# Patient Record
Sex: Female | Born: 1990 | Race: Black or African American | Hispanic: No | Marital: Single
Health system: Southern US, Community
[De-identification: ages and names within clinical notes are randomized; demographics above are authoritative.]

---

## 2011-09-21 ENCOUNTER — Emergency Department: Payer: Self-pay | Admitting: Emergency Medicine

## 2011-12-21 ENCOUNTER — Inpatient Hospital Stay: Payer: Self-pay | Admitting: Obstetrics and Gynecology

## 2011-12-21 LAB — RBC: RBC: 3.24 10*6/uL — ABNORMAL LOW (ref 3.80–5.20)

## 2011-12-21 LAB — URINALYSIS, COMPLETE
Bilirubin,UR: NEGATIVE
Glucose,UR: 50 mg/dL (ref 0–75)
Ph: 6 (ref 4.5–8.0)
Protein: 30
RBC,UR: 14 /HPF (ref 0–5)
Squamous Epithelial: 25
WBC UR: 35 /HPF (ref 0–5)

## 2011-12-21 LAB — PLATELET COUNT: Platelet: 243 10*3/uL (ref 150–440)

## 2011-12-21 LAB — DRUG SCREEN, URINE
Amphetamines, Ur Screen: NEGATIVE (ref ?–1000)
Barbiturates, Ur Screen: NEGATIVE (ref ?–200)
Cannabinoid 50 Ng, Ur ~~LOC~~: POSITIVE (ref ?–50)
Cocaine Metabolite,Ur ~~LOC~~: NEGATIVE (ref ?–300)
MDMA (Ecstasy)Ur Screen: NEGATIVE (ref ?–500)
Methadone, Ur Screen: NEGATIVE (ref ?–300)
Tricyclic, Ur Screen: NEGATIVE (ref ?–1000)

## 2011-12-21 LAB — BASIC METABOLIC PANEL
Anion Gap: 10 (ref 7–16)
Calcium, Total: 8.5 mg/dL (ref 8.5–10.1)
Chloride: 111 mmol/L — ABNORMAL HIGH (ref 98–107)
Co2: 22 mmol/L (ref 21–32)
Creatinine: 0.45 mg/dL — ABNORMAL LOW (ref 0.60–1.30)
Osmolality: 283 (ref 275–301)
Potassium: 3.5 mmol/L (ref 3.5–5.1)

## 2011-12-21 LAB — HEMOGLOBIN A1C: Hemoglobin A1C: 5.3 % (ref 4.2–6.3)

## 2011-12-21 LAB — GLUCOSE, RANDOM: Glucose: 78 mg/dL (ref 65–99)

## 2011-12-21 LAB — RAPID HIV-1/2 QL/CONFIRM: HIV-1/2,Rapid Ql: NEGATIVE

## 2012-02-11 ENCOUNTER — Observation Stay: Payer: Self-pay | Admitting: Obstetrics & Gynecology

## 2012-06-05 ENCOUNTER — Emergency Department: Payer: Self-pay | Admitting: Emergency Medicine

## 2012-06-05 LAB — COMPREHENSIVE METABOLIC PANEL
Alkaline Phosphatase: 73 U/L (ref 50–136)
Anion Gap: 6 — ABNORMAL LOW (ref 7–16)
BUN: 12 mg/dL (ref 7–18)
Bilirubin,Total: 0.3 mg/dL (ref 0.2–1.0)
Calcium, Total: 8.7 mg/dL (ref 8.5–10.1)
Chloride: 109 mmol/L — ABNORMAL HIGH (ref 98–107)
Creatinine: 0.67 mg/dL (ref 0.60–1.30)
EGFR (African American): >60
EGFR (Non-African Amer.): >60
Glucose: 121 mg/dL — ABNORMAL HIGH (ref 65–99)
Osmolality: 282 (ref 275–301)
SGOT(AST): 43 U/L — ABNORMAL HIGH (ref 15–37)
Sodium: 141 mmol/L (ref 136–145)
Total Protein: 7.7 g/dL (ref 6.4–8.2)

## 2012-06-05 LAB — URINALYSIS, COMPLETE
Bilirubin,UR: NEGATIVE
Ketone: NEGATIVE
Nitrite: NEGATIVE
Protein: 30
Specific Gravity: 1.024 (ref 1.003–1.030)

## 2012-06-05 LAB — CBC
HCT: 33.8 % — ABNORMAL LOW (ref 35.0–47.0)
HGB: 11.1 g/dL — ABNORMAL LOW (ref 12.0–16.0)
MCV: 87 fL (ref 80–100)
RDW: 14.8 % — ABNORMAL HIGH (ref 11.5–14.5)

## 2012-06-19 ENCOUNTER — Ambulatory Visit: Payer: Self-pay | Admitting: Surgery

## 2012-06-19 LAB — BASIC METABOLIC PANEL
Calcium, Total: 8.5 mg/dL (ref 8.5–10.1)
Chloride: 107 mmol/L (ref 98–107)
Co2: 25 mmol/L (ref 21–32)
Creatinine: 0.65 mg/dL (ref 0.60–1.30)
EGFR (Non-African Amer.): >60
Glucose: 238 mg/dL — ABNORMAL HIGH (ref 65–99)
Osmolality: 281 (ref 275–301)
Potassium: 3.9 mmol/L (ref 3.5–5.1)
Sodium: 137 mmol/L (ref 136–145)

## 2012-06-19 LAB — HEPATIC FUNCTION PANEL A (ARMC)
Albumin: 3.3 g/dL — ABNORMAL LOW (ref 3.4–5.0)
Alkaline Phosphatase: 78 U/L (ref 50–136)
Bilirubin, Direct: <0.1 mg/dL (ref 0.00–0.20)
Bilirubin,Total: 0.1 mg/dL — ABNORMAL LOW (ref 0.2–1.0)
SGOT(AST): 22 U/L (ref 15–37)
SGPT (ALT): 35 U/L (ref 12–78)

## 2012-06-19 LAB — CBC WITH DIFFERENTIAL/PLATELET
Basophil %: 0.5 %
Eosinophil %: 2.5 %
HGB: 11.6 g/dL — ABNORMAL LOW (ref 12.0–16.0)
Lymphocyte #: 2.4 10*3/uL (ref 1.0–3.6)
Lymphocyte %: 30.5 %
MCH: 29.5 pg (ref 26.0–34.0)
MCHC: 34.2 g/dL (ref 32.0–36.0)
Monocyte #: 0.4 x10 3/mm (ref 0.2–0.9)
Neutrophil %: 61.2 %
Platelet: 312 10*3/uL (ref 150–440)
RBC: 3.93 10*6/uL (ref 3.80–5.20)
RDW: 15.1 % — ABNORMAL HIGH (ref 11.5–14.5)
WBC: 8 10*3/uL (ref 3.6–11.0)

## 2012-06-25 ENCOUNTER — Ambulatory Visit: Payer: Self-pay | Admitting: Surgery

## 2012-10-12 ENCOUNTER — Emergency Department: Payer: Self-pay | Admitting: Emergency Medicine

## 2012-10-13 LAB — URINALYSIS, COMPLETE
Blood: NEGATIVE
Glucose,UR: 100 mg/dL (ref 0–75)
Nitrite: NEGATIVE
RBC,UR: 4 /HPF (ref 0–5)
Specific Gravity: 1.005 (ref 1.003–1.030)
Squamous Epithelial: 1
WBC UR: 5 /HPF (ref 0–5)

## 2014-06-10 NOTE — Consult Note (Signed)
Referral Information:   Reason for Referral Diabetes and new diagnosis of pregnancy    Referring Physician Adria Devonarrie Klett, MD    Prenatal Hx 24 year old G1 at 2631 1/7 weeks' gestation (Pregnancy dated by ultrasound done yesterday[facility] Until 11/14/2011, the patient did not have the diagnosis of pregnancy. Suspecting she was pregnant, the patient went to the health department and had a positive pregnancy test. She received prenatal vitamins. She was told that the health department could not care for her because of her diabetes. The patient states that she was not given a referral for prenatal care. Yesterday, the patient presented secondary to her not having any insulin. The patient states that she has had no insulin for a few days. The patient reports a possible menses in March and some bleeding in July. Other than this, the patient reports no antenatal complications.   Prior to her diagnosis of pregnancy, the patient was using Lantus Insulin (50 units at bedtime) and Metformin to control her blood glucoses. With the diagnosis of pregnancy, the patient stopped the Metformin.  The patient [place] in late August of this year.    Past Obstetrical Hx G1   Allergies:   No Known Allergies:   Vital Signs/Notes:  Nursing Vital Signs: **Vital Signs.:   31-Oct-13 07:36   Vital Signs Type Admission   Temperature Temperature (F) 97.9   Celsius 36.6   Temperature Source Oral   Pulse Pulse 81   Respirations Respirations 18   Systolic BP Systolic BP 125   Diastolic BP (mmHg) Diastolic BP (mmHg) 69   Mean BP 87   Fetal Heart Tones  145   Perinatal Consult:   Past Medical History cont'd OB/GYN History Menarche age 24 Irregular cycles No STDS No Dysplasia No Gyn diagnoses or Gyn surgery  Medical History Diabetes since age 24. The diagnosis was made when the patient was seen for possible UTI and found that she had a high level of glucose in her urine.  Her only hospitalization for her diabetes was then.  Patient has been using insulin since diagnosis. Patient reports she has been on Lantus 50 units at night for approximately 2 years.  Patient also started on Metformin at age 24.  The patient states that before pregnancy, her most recent hemoglobin A1C was done in January. The value was greater than 11. She states that this value was typical for her. Of note, a hemolgobin A1C done yesterday was 5.3. The patient states that she only checks her fingerstick blood glucoses in the morning. They range from 70s to 100s. The patient reports no sequellae secondary to her diabetes  Asthma - no regular meds; has rescue inhaler that she has not used in years  Immunizations - patient reports that she is up to date; no influenza yet this year.  Social History Tobacco use -- none Alcohol use -- none Drug use -- none; patient had a urine drug screen that was positive for marijuana. She states she does not use marijuana but is around people who use it.  Family History Mother died age 23 of a brain aneurysm; she also had diabetes and hypertension Father alive and well 2 Brothers alive and well 1 Sister with diabetes    PSurg Hx no surgery    Soc Hx single   Review Of Systems:   Subjective All other systems are negative to review     Additional Lab/Radiology Notes Ultrasound done yesterday showed a pregnancy with  a composite gestational age of 28 0/7 weeks; AFI=11.6 cm; EFW=1512 grams; cephalic   Impression/Recommendations:   Impression 1) Intrauterine pregnancy at 31 1/7 weeks 2) Pregnancy complicated by diabetes    a) Diabetes x 9 years    b) Apparent good control as evidenced by hemoglobin A1C 3) Pregnancy complicated by obesity 4) Pregnancy complicated by poor prenatal care    Recommendations 1) Routine prenatal care   a) Establish care with primary obstetrician   b) Influenza vaccine prior to discharge   c) TDAP vaccine prior to  discharge 2) Diabetes care   a) Co-manage care with [facility] here in [place]   b) Pneumovax prior to discharge   c) EKG before discharge   d) 2400 Kcal ADA diet   e) Restart Lantus insulin 50 units at night (do not start other insulin yet)   f) Patient should be instructed to check fingerstick blood glucoses 4x/day     i) fasting - goal < 95 mg/dl     ii) 2 hour postprandial - goal < 120 mg/dl     iii) Patient should not leave hosptial until it is certain she has insulin, glucometer and test strips   g) Have patient collect 24 urine (outpatient) for      i) Total protein     ii) Creatinine clearance   h) Have patient see [facility] in 1 week for     i) Detailed (level II) ultrasound     ii) NST/AFI - this should be weekly and then after 36 weeks twice weekly      iii) review of fingerstick blood glucoses    j) Follow up growth ultrasound in 3 weeks    k) Fetal echocardiogram    l) Delivery recommendations will come later and be based on level of control and absence/presence of complications     Total Time Spent with Patient 45 minutes    >50% of visit spent in couseling/coordination of care yes    Office Use Only 99[address]  INPT Consult Exp Prob Focused (40 min)   Coding Description: MATERNAL CONDITIONS/HISTORY INDICATION(S).   Diabetes - DM.  Electronic Signatures: Marcelino Scot (MD)  (Signed 31-Oct-13 10:18)  Authored: Referral, Allergies, Vital Signs/Notes, Consult, Exam, Lab/Radiology Notes, Impression, Billing, Coding Description   Last Updated: 31-Oct-13 10:18 by Marcelino Scot (MD)

## 2014-06-13 NOTE — Op Note (Signed)
PATIENT NAME:  Karen Greene, Karen Greene MR#:  [id number] DATE OF BIRTH:  Sep 25, 1990  DATE OF PROCEDURE:  06/25/2012  PREOPERATIVE DIAGNOSIS: Symptomatic cholelithiasis.   POSTOPERATIVE DIAGNOSIS: Symptomatic cholelithiasis.   PROCEDURE: Laparoscopic cholecystectomy.   SURGEON: Dionne Miloichard Jakeisha Stricker, MD  ASSISTANT:  Connye BurkittMelissa Holmes, PA-S  ANESTHESIA: General with endotracheal tube.   INDICATIONS: This is a patient with epigastrium and right upper quadrant pain associated with fatty food intolerance and work-up showing gallstones. Preoperatively we discussed rationale for surgery, the options of observation, risk of bleeding, infection, recurrence of symptoms, failure to resolve her symptoms, open procedure, bile duct damage, bile duct leak, and retained common bile duct stone any of which could require further surgery and/or ERCP, stent and papillotomy. This was all reviewed for her in the preop holding area. She understood and agreed to proceed.   FINDINGS: Multiple small gallstones, small cystic duct.   DESCRIPTION OF PROCEDURE: The patient was induced to general anesthesia, given IV antibiotics. VTE prophylaxis was in place. She was prepped and draped in a sterile fashion. Marcaine was infiltrated in skin and subcutaneous tissues around the periumbilical area. An incision was made and an extra long Veress needle was utilized due to her morbid obesity to allow for placement and obtaining a pneumoperitoneum.  Once the pneumoperitoneum was adequate, the 5 mm trocar port was placed. The abdominal cavity was explored and under direct vision a 10 mm epigastric port and 2 lateral 5 mm ports were placed. The gallbladder was placed on tension. Peritoneum over the infundibulum was incised bluntly. The cystic duct gallbladder junction was well identified. The cystic artery was well identified, doubly clipped and divided. This allowed for good visualization of the cystic duct as it entered the infundibulum of the  gallbladder. Here it was doubly clipped and divided and the gallbladder was taken from the gallbladder fossa with electrocautery and passed out through the epigastric port site with the aid of an Endo Catch bag. The area was checked for hemostasis and found to be adequate. There was no sign of bleeding, bile leak or bowel injury. The camera was placed in the epigastric site to view back to the periumbilical site. There was no sign of adhesions or bowel injury. Therefore, pneumoperitoneum was released. All ports were removed. Fascial edges at the epigastric site were approximated with figure-of-eight 0 Vicryl.  4-0 subcuticular Monocryl was used on all skin edges. Steri-Strips, Mastisol and sterile dressings were placed.   The patient tolerated the procedure well. There were no complications. She was taken to the recovery room in stable condition to be discharged in the care of her family and follow up in 10 days.  ____________________________ Adah Salvageichard Greene. Excell Seltzerooper, MD rec:sb D: 06/25/2012 08:11:22 ET T: 06/25/2012 08:19:47 ET JOB#: [id number]  cc: Adah Salvageichard Greene. Excell Seltzerooper, MD, <Dictator> Lattie HawICHARD Greene Emilliano Dilworth MD ELECTRONICALLY SIGNED 06/25/2012 12:50

## 2014-06-13 NOTE — H&P (Signed)
PATIENT NAME:  Karen Greene, Karen Greene MR#:  [id number] DATE OF BIRTH:  19-Apr-1990  DATE OF ADMISSION:  06/25/2012  CHIEF COMPLAINT:  Right upper quadrant pain.   HISTORY OF PRESENT ILLNESS:  This is a 24 year old female patient who has had a single episode of right upper quadrant pain. She is diabetic and was in the Emergency Room, and was sent home from the ER. Seen in the office ultimately, and a workup had shown gallstones with normal liver function tests. She is here for elective laparoscopic cholecystectomy for control of her symptoms.   PAST MEDICAL HISTORY:  Diabetes and asthma.   PAST SURGICAL HISTORY:  None.   MEDICATIONS:  Lantus.   ALLERGIES:  None.   SOCIAL HISTORY: The patient does not drink alcohol products, but does smoke every day approximately 1/2 pack per day.   FAMILY HISTORY:  Noncontributory.   REVIEW OF SYSTEMS:  A 10-system review was performed and negative, with the exception of that mentioned in the HPI.    PHYSICAL EXAMINATION: VITAL SIGNS:  A 297-pound patient, BMI of 44.  GENERAL:  She appears comfortable.  HEENT:  No scleral icterus.  No palpable neck nodes.  CHEST:  Clear to auscultation.  CARDIAC:  Regular rate and rhythm.  ABDOMEN:  Soft, minimally tender in the right upper quadrant.  EXTREMITIES:  Without edema.  NEUROLOGIC:  Grossly intact.  INTEGUMENT:  No jaundice.   DIAGNOSTIC DATA:  Laboratory values are within normal limits.   Ultrasound shows gallstones.   ASSESSMENT AND PLAN:  This is a patient with symptomatic cholelithiasis, here for elective laparoscopic cholecystectomy for control of (Dictation Anomaly) <<her symptoms>> The rationale for surgery has been discussed. The options of observation have been reviewed and the risk of bleeding, infection, recurrence of symptoms, failure to resolve her symptoms, open procedure, bile duct damage, bile duct leak, retained common bile duct stone, any of which could require further surgery and/or ERCP,  stent, papillotomy, have been reviewed with her and will be reviewed again in the preop holding area.     ____________________________ Adah Salvageichard Greene. Excell Seltzerooper, MD rec:mr D: 06/24/2012 21:01:00 ET T: 06/24/2012 22:19:23 ET JOB#: [id number]  cc: Adah Salvageichard Greene. Excell Seltzerooper, MD, <Dictator> Lattie HawICHARD Greene COOPER MD ELECTRONICALLY SIGNED 06/25/2012 7:33

## 2014-07-01 NOTE — H&P (Signed)
L&D Evaluation:  History Expanded:   HPI 24 yo G1p0 at unknown gestational age. diagnosed with pos preg test on 10/28 at the health dept. she has not had blood work or us to determine fetal age yet. she has been an IDDM since the age of 24 and has had three different types of insulin in THE [facility]. she is currently on lantus 50 munits a day but ran out of her rx and she needs more. her medicaid has not yet kicked in and she needs the meds.    Gravida 1    Term 0    PreTerm 0    Abortion 0    Living 0    Blood Type (Maternal) unknown    Group B Strep Results Maternal (Result >5wks must be treated as unknown) unknown/result > 5 weeks ago    Maternal HIV Unknown    Maternal Syphilis Ab Unknown    Maternal Varicella Unknown    Rubella Results (Maternal) unknown    Maternal T-Dap Unknown    [facility]    Presents with abdominal pain, needs insulin,    Patient's Medical History Asthma  Diabetes  as a child, and insulin dependent dm    Patient's Surgical History none    Medications Pre Natal Vitamins  lantus    Allergies NKDA    Social History none    Family History Non-Contributory    Current Prenatal Course Notable For No Prenatal Care  diabetes   ROS:   ROS All systems were reviewed.  HEENT, CNS, GI, GU, Respiratory, CV, Renal and Musculoskeletal systems were found to be normal.   Exam:   Vital Signs stable    Urine Protein not completed    General no apparent distress    Mental Status clear    Chest clear    Heart normal sinus rhythm    Abdomen gravid, non-tender    Back no CVAT    Edema no edema    Pelvic no external lesions    Mebranes Intact    FHT normal rate with no decels    Fetal Heart Rate 140    Skin dry    Lymph no lymphadenopathy   Impression:   Impression diabetes  out of meds,   Plan:   Plan UA    Comments get pnlabs and check for [facility] she has never been in [facility], if is critical Karen transfer orherwise get her to see  DPN tomorrow and then decide on care pattern, having a very bad headache.    Follow Up Appointment need to schedule   Electronic Signatures: Karen Greene, Karen Greene (MD)  (Signed 30-Oct-13 21:23)  Authored: L&D Evaluation   Last Updated: 30-Oct-13 21:23 by Karen Greene, Karen Greene (MD)

## 2014-07-01 NOTE — H&P (Signed)
L&D Evaluation:  History Expanded:   HPI 24 yo G1p0 at 2639 weeks gestational age and she has been an IDDM since the age of 24 and has had three different types of insulin. [facility] and was scheduled for Induction of labor there but has been unable to get ride there.  Called EMS and they brought her here. [facility]  Her doctor there also desires for her to come there.  Thought her water broke this evening.    Gravida 1    Term 0    PreTerm 0    Abortion 0    Living 0    Blood Type (Maternal) unknown    Group B Strep Results Maternal (Result >5wks must be treated as unknown) unknown/result > 5 weeks ago    Maternal HIV Unknown    Maternal Syphilis Ab Unknown    Maternal Varicella Unknown    Rubella Results (Maternal) unknown    Maternal T-Dap Unknown    [facility]    Presents with Oligo    Patient's Medical History Asthma  Diabetes  as a child, and insulin dependent dm    Patient's Surgical History none    Medications Pre Natal Vitamins  lantus    Allergies NKDA    Social History none    Family History Non-Contributory    Current Prenatal Course Notable For No Prenatal Care  diabetes   ROS:   ROS All systems were reviewed.  HEENT, CNS, GI, GU, Respiratory, CV, Renal and Musculoskeletal systems were found to be normal.   Exam:   Vital Signs stable    Urine Protein not completed    General no apparent distress    Mental Status clear    Abdomen gravid, non-tender    Back no CVAT    Edema no edema    Pelvic no external lesions    Mebranes Intact    FHT normal rate with no decels    Fetal Heart Rate 150    Ucx absent    Skin dry    Lymph no lymphadenopathy   Impression:   Impression diabetes, oligohydramnios, no ROM   Plan:   Plan EFM/NST, monitor contractions and for cervical change    Comments Transfer to [facility] for Induction of labor for Oligo and Diabetese Intrapartum Mgt.    Follow Up Appointment  need to schedule   Electronic Signatures: Letitia LibraHarris, Arne Schlender Paul (MD)  (Signed 21-Dec-13 22:29)  Authored: L&D Evaluation   Last Updated: 21-Dec-13 22:29 by Letitia LibraHarris, Shakirra Buehler Paul (MD)

## 2014-10-17 IMAGING — US US OB US >=[ID] SNGL FETUS
1 series · 14 of 28 positions shown · non-contrast
Comparison: none

REASON FOR EXAM: No Prenatal Care
COMMENTS:

[Series 1: us ob us >=(id) sngl fetus · 0.26mm/px · 14 of 58 slices shown]
[im 3/58]
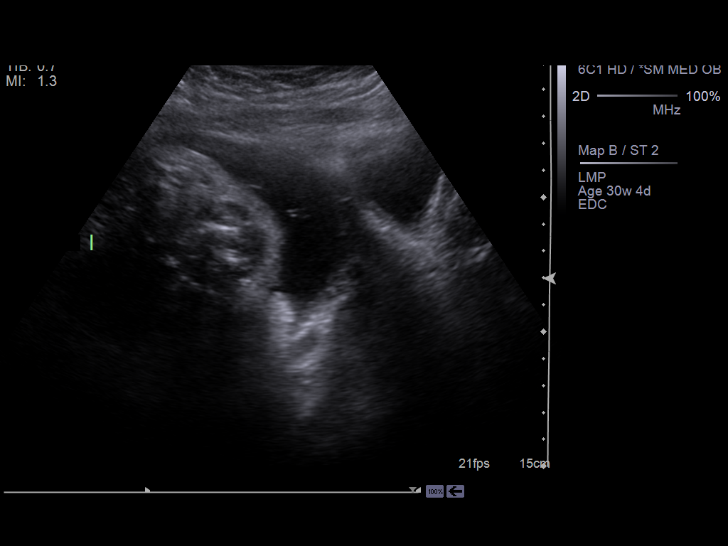
[im 7/58]
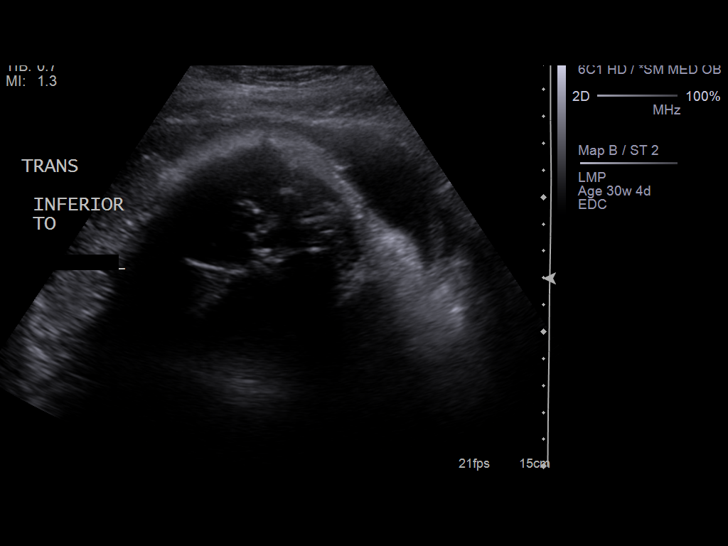
[im 11/58]
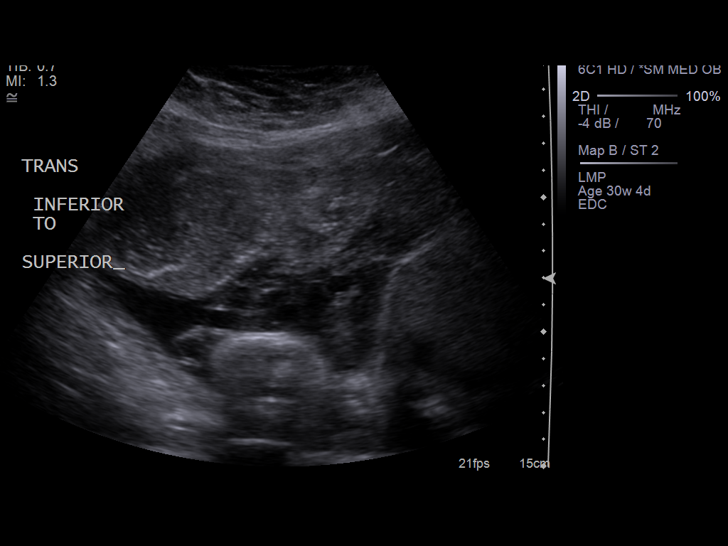
[im 15/58]
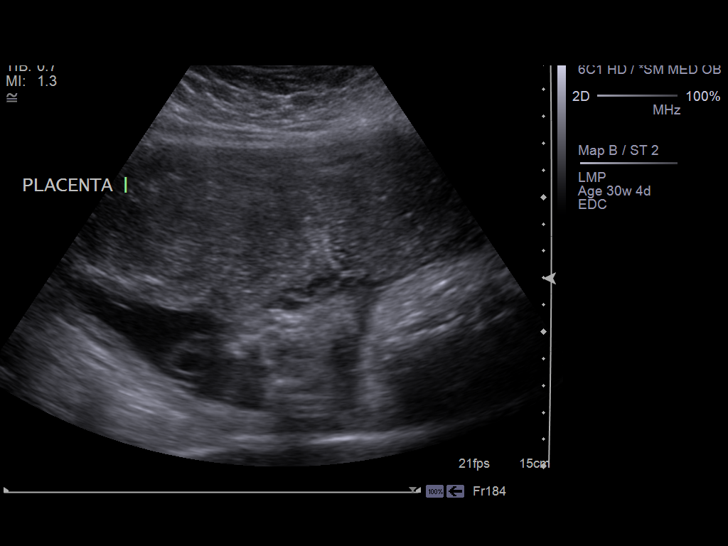
[im 20/58]
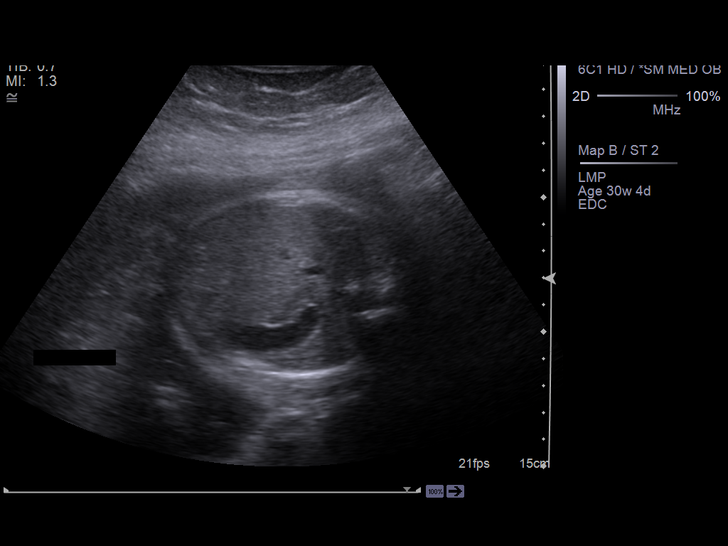
[im 24/58]
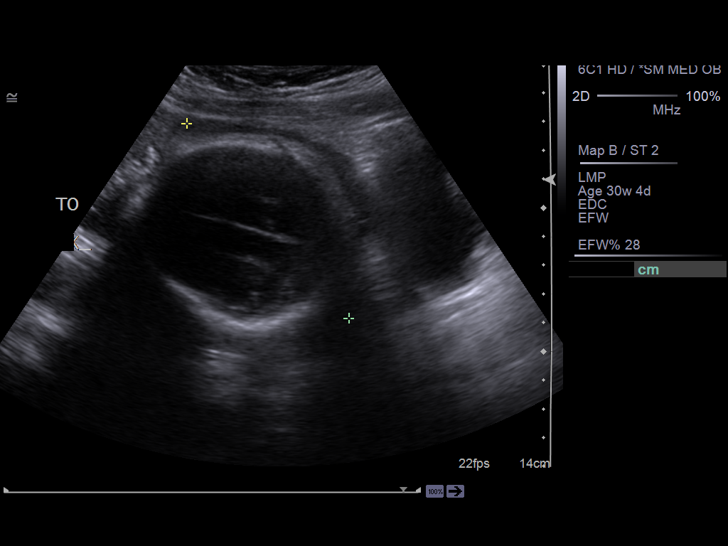
[im 28/58]
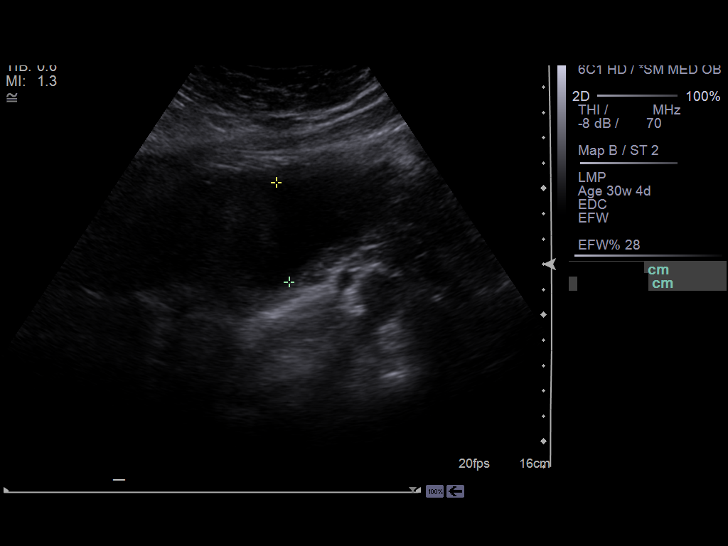
[im 32/58]
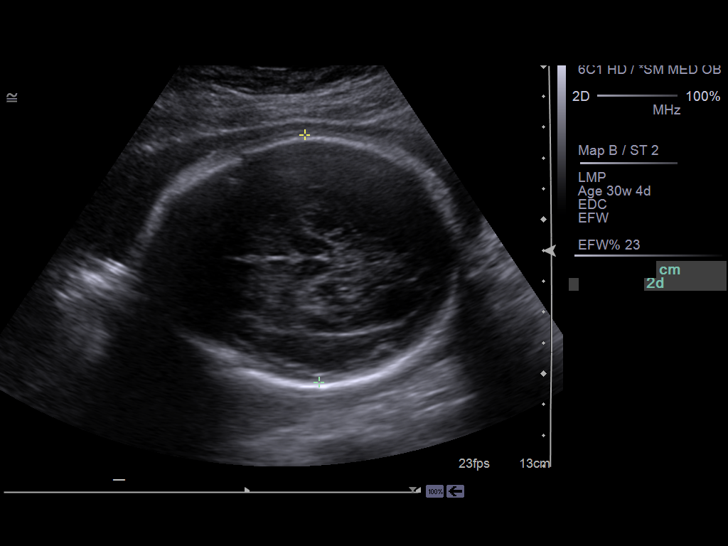
[im 36/58]
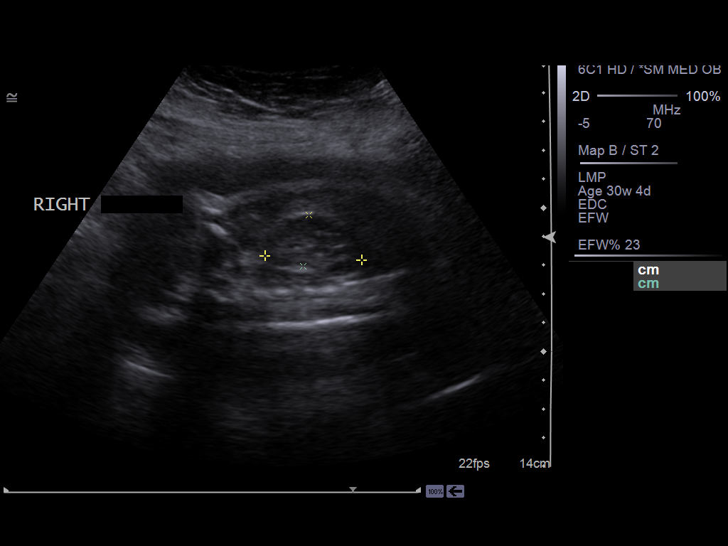
[im 41/58]
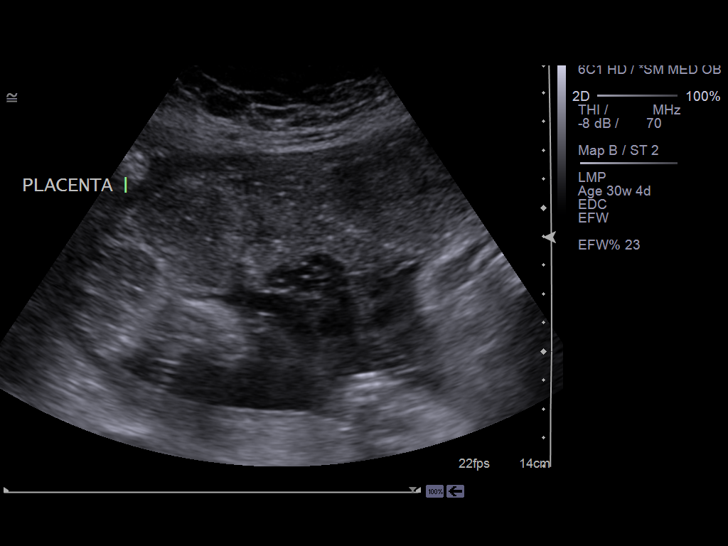
[im 45/58]
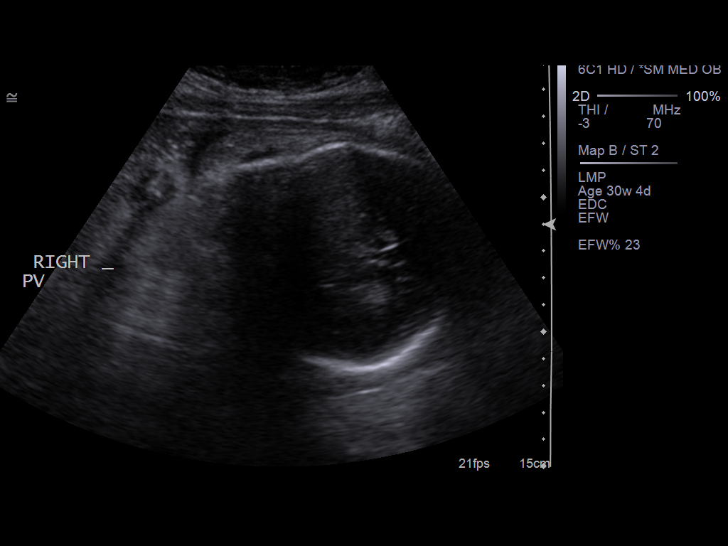
[im 49/58]
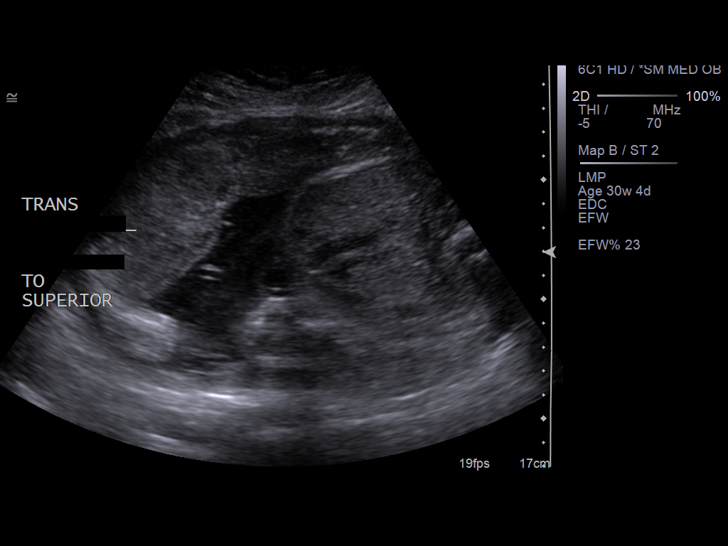
[im 53/58]
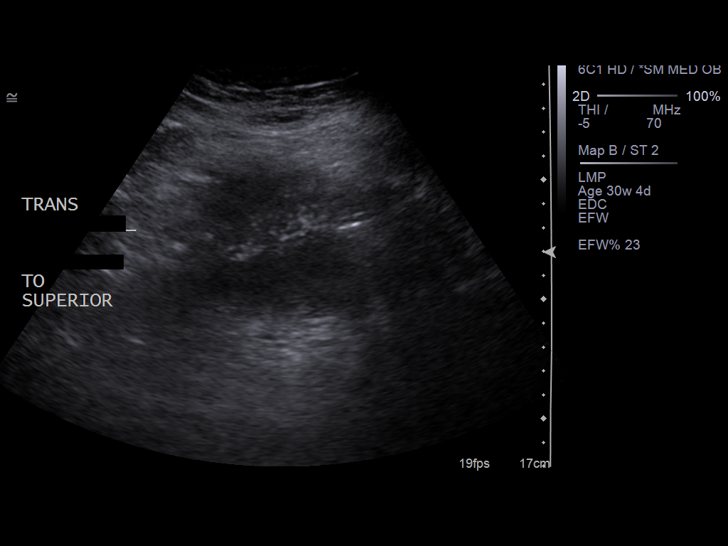
[im 58/58]
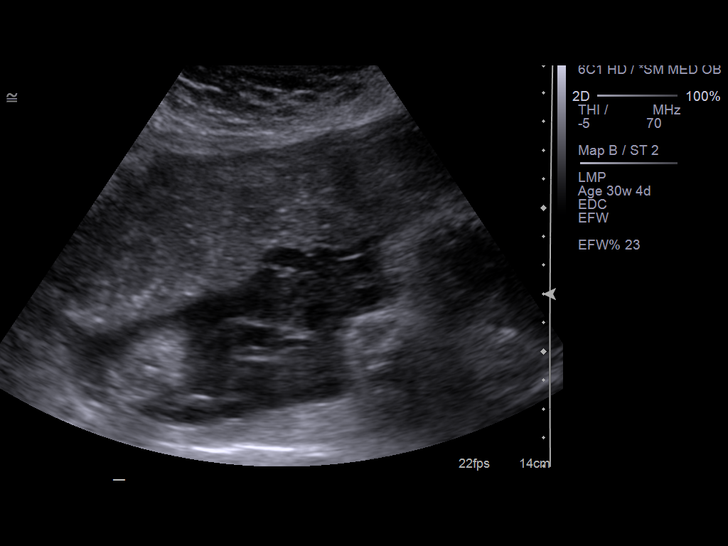

[14 of 28 positions shown; findings below may reference images not displayed]

PROCEDURE:     US  - US OB GREATER/OR EQUAL TO HX8B4  - December 21, 2011 [DATE]

RESULT:     Single viable intrauterine pregnancy is present with fetal heart
rate of 143 beats per minute. Estimated fetal weight is 8981 plus or minus
224 grams. Fetal mean gestational age by biometry is 31 weeks 0 days. Fetal
presentation is cephalic. Amniotic fluid index is approximately
meters. Placenta is anterior. No evidence of previa. Fetal parts evaluation
not well evaluated due to fetal position and mother's body habitus.
Ultrasound at a high-risk clinicor tertiary care center should be considered.
IMPRESSION: Single viable intrauterine pregnancy at 31 weeks 0 days
with fetal heart rate of 123 beats per minute.

## 2015-04-02 IMAGING — US ABDOMEN ULTRASOUND LIMITED
1 series · 14 of 25 positions shown · non-contrast
Comparison: none

REASON FOR EXAM: RUQ/epigastric pain
COMMENTS:   Body Site: GB and Fossa, CBD, Head of Pancreas

PROCEDURE:     US  - US ABDOMEN LIMITED SURVEY  - June 05, 2012 [DATE]
RESULT:     Comparison: None.
TECHNIQUE: Multiple grayscale and color Doppler images were obtained of the
right upper quadrant.

[Series 1: abdomen ultrasound limited · 0.28mm/px · 14 of 46 slices shown]
[im 1/46]
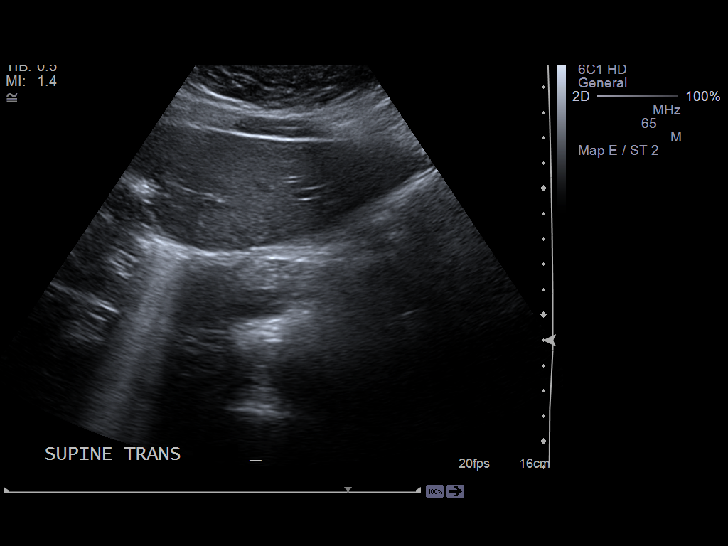
[im 4/46]
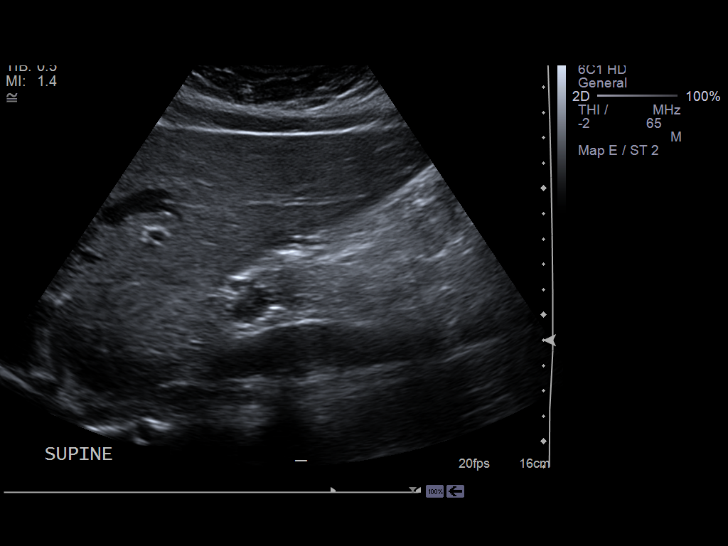
[im 8/46]
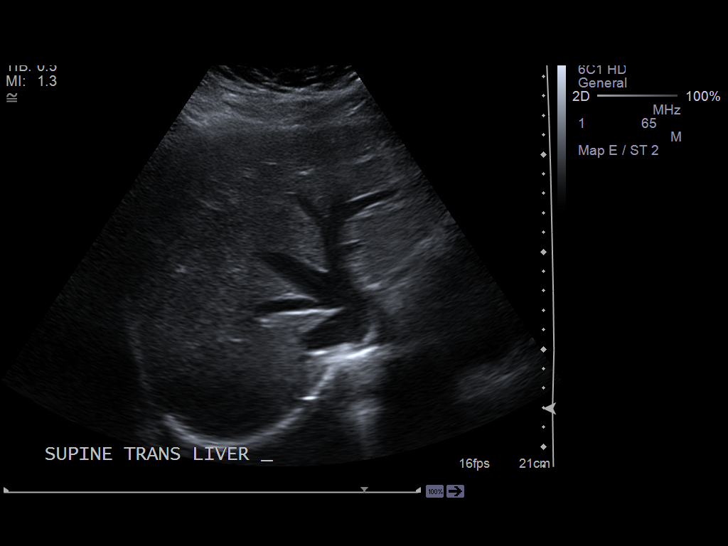
[im 12/46]
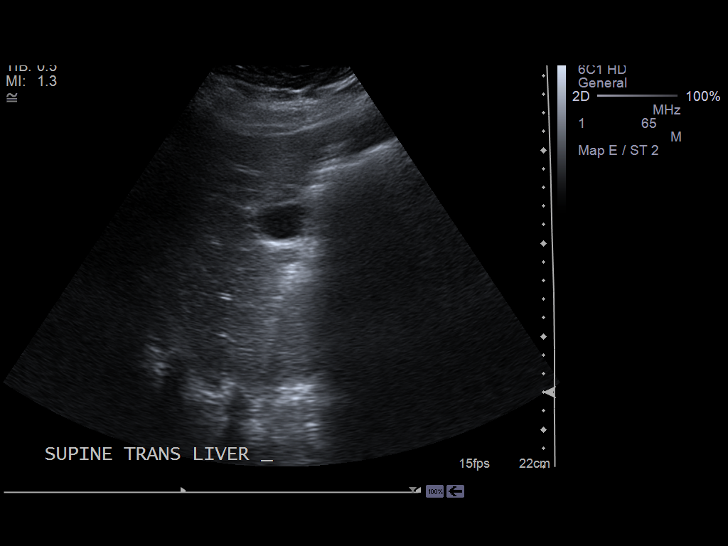
[im 16/46]
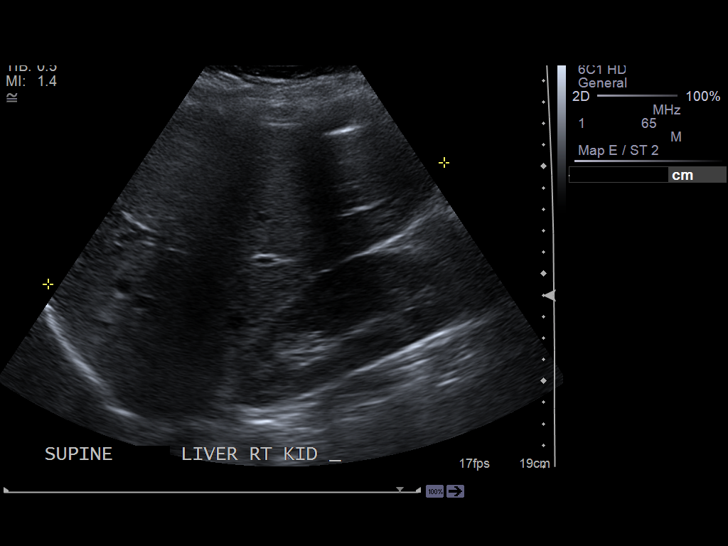
[im 17/46]
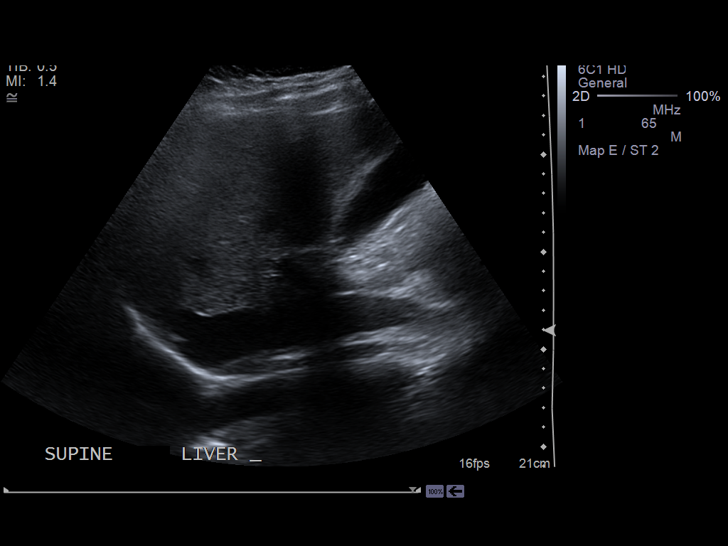
[im 21/46]
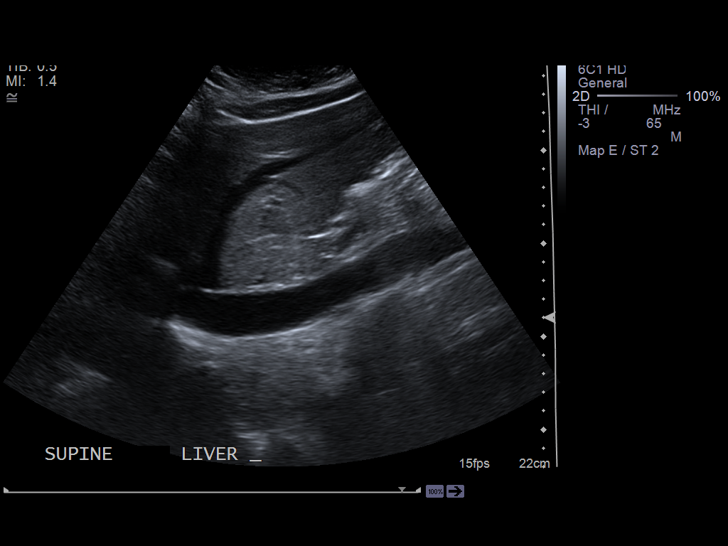
[im 25/46]
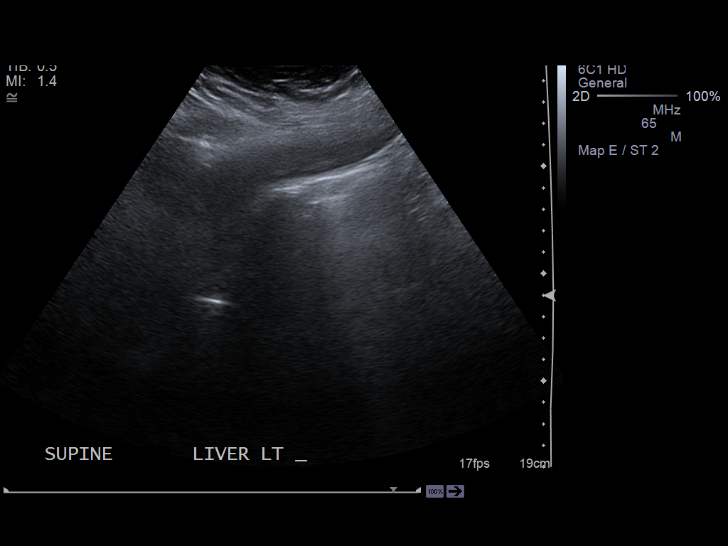
[im 29/46]
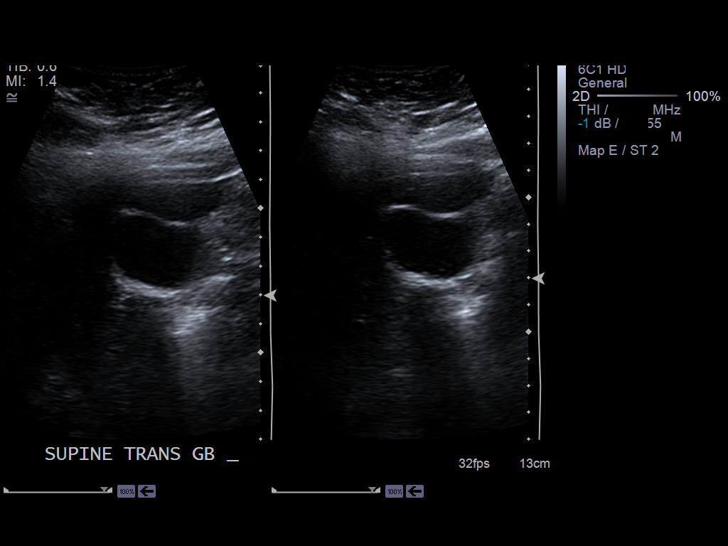
[im 31/46]
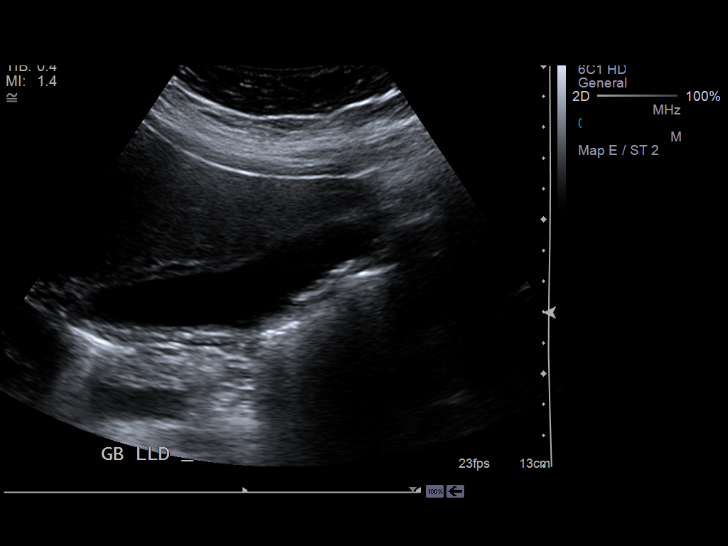
[im 34/46]
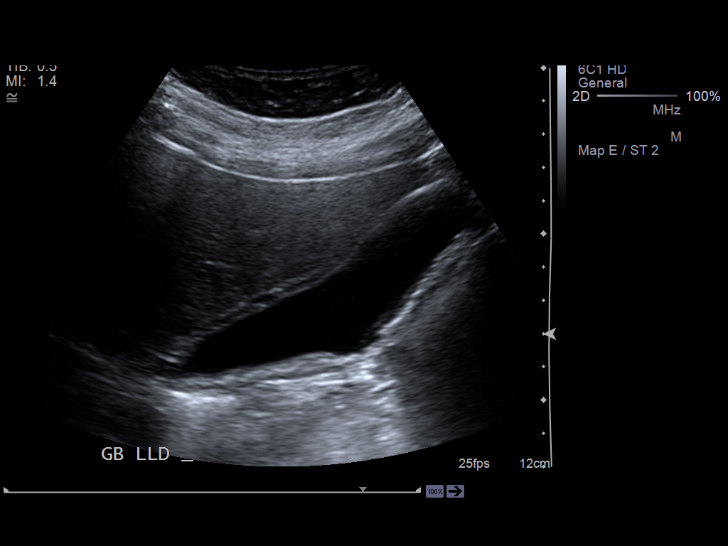
[im 38/46]
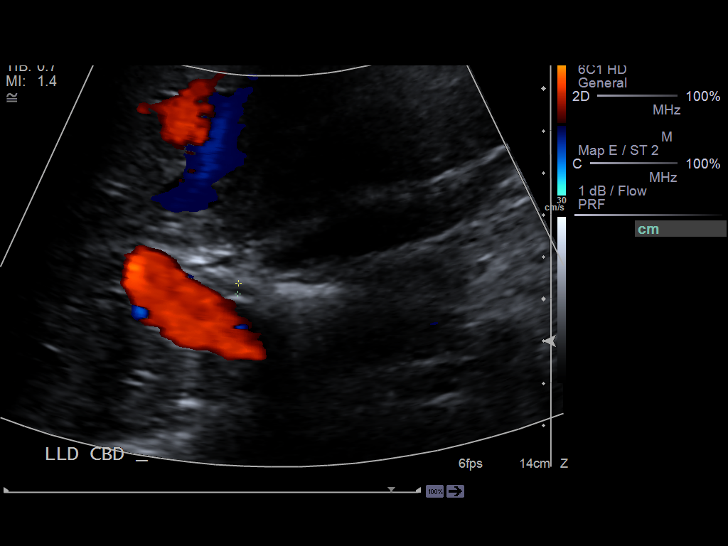
[im 42/46]
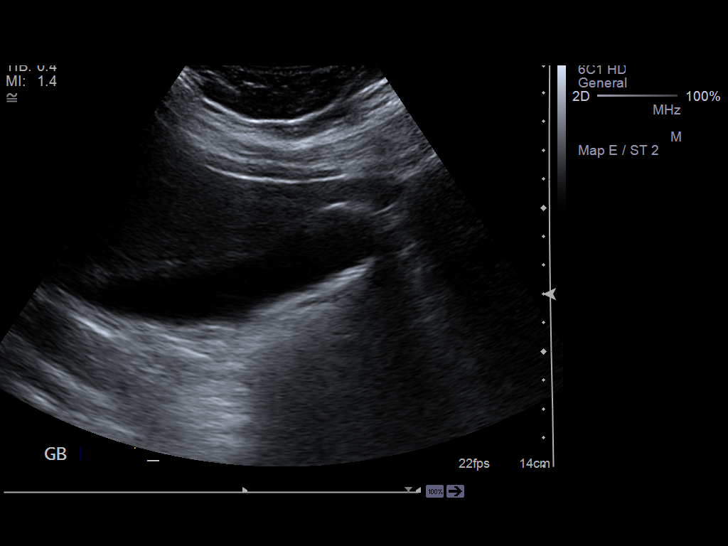
[im 46/46]
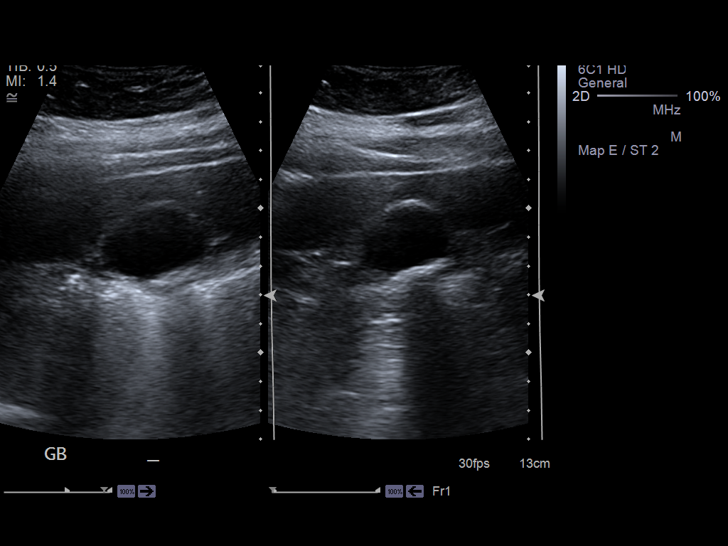

[14 of 25 positions shown; findings below may reference images not displayed]

FINDINGS: The pancreas was mostly obscured by overlying bowel gas. The liver is
borderline enlarged, measuring 19 cm in length along the midaxillary line.
The main portal vein is patent. The common bile duct measures 3 mm in
diameter. Small stones are present within the gallbladder. A portion of the
gallbladder wall is mildly thickened, measuring 4 mm in thickness. Other
parts the gallbladder wall are normal in thickness. The sonographic Murphy
sign was negative.
IMPRESSION: Cholelithiasis. A portion of the gallbladder wall is mildly thickened,
though the sonographic Murphy sign was negative. Correlate for acute
cholecystitis.

[REDACTED]

## 2015-04-02 IMAGING — CR DG CHEST 2V
1 series · 3 of 3 positions shown · non-contrast
Comparison: none

REASON FOR EXAM: wheezing, abdominal pain
COMMENTS:

PROCEDURE:     DXR - DXR CHEST PA (OR AP) AND LATERAL  - June 05, 2012  [DATE]
RESULT:     Comparison: None.

[Series 1: lat · 0.17mm/px · 3 of 3 slices shown]
[im 1/3]
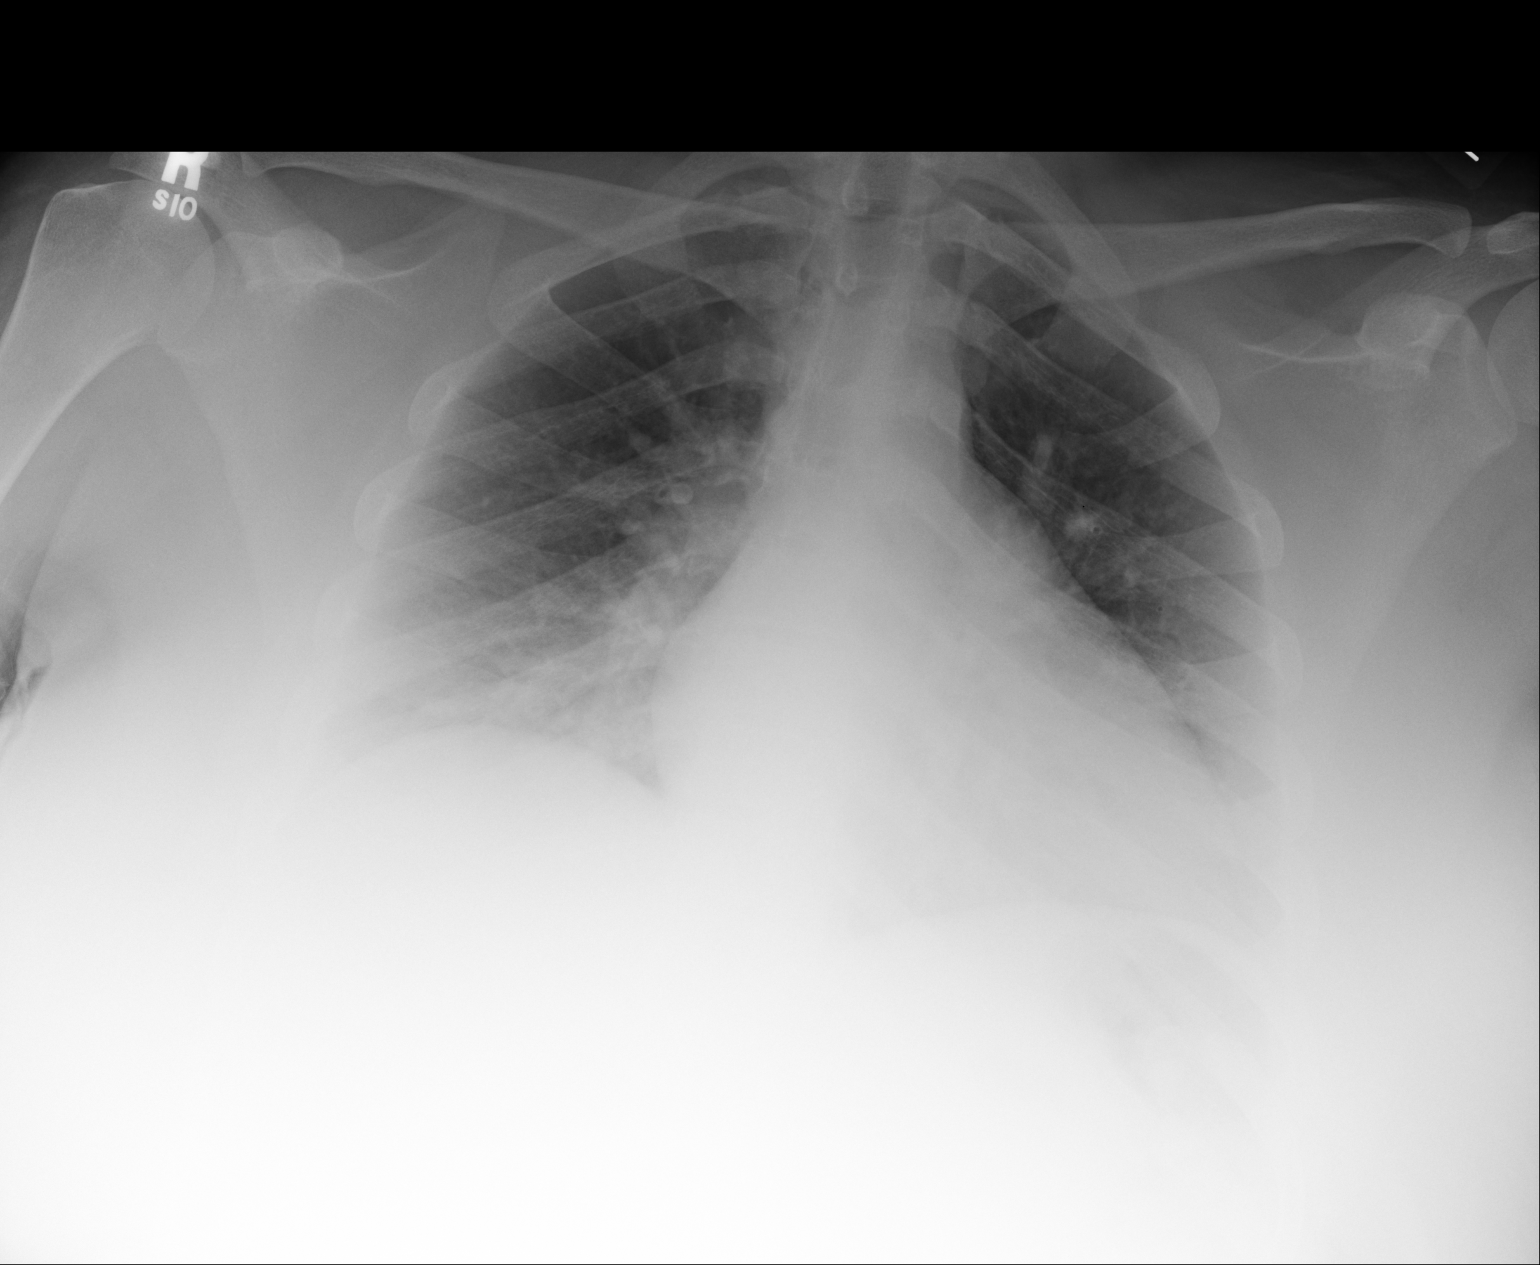
[im 2/3]
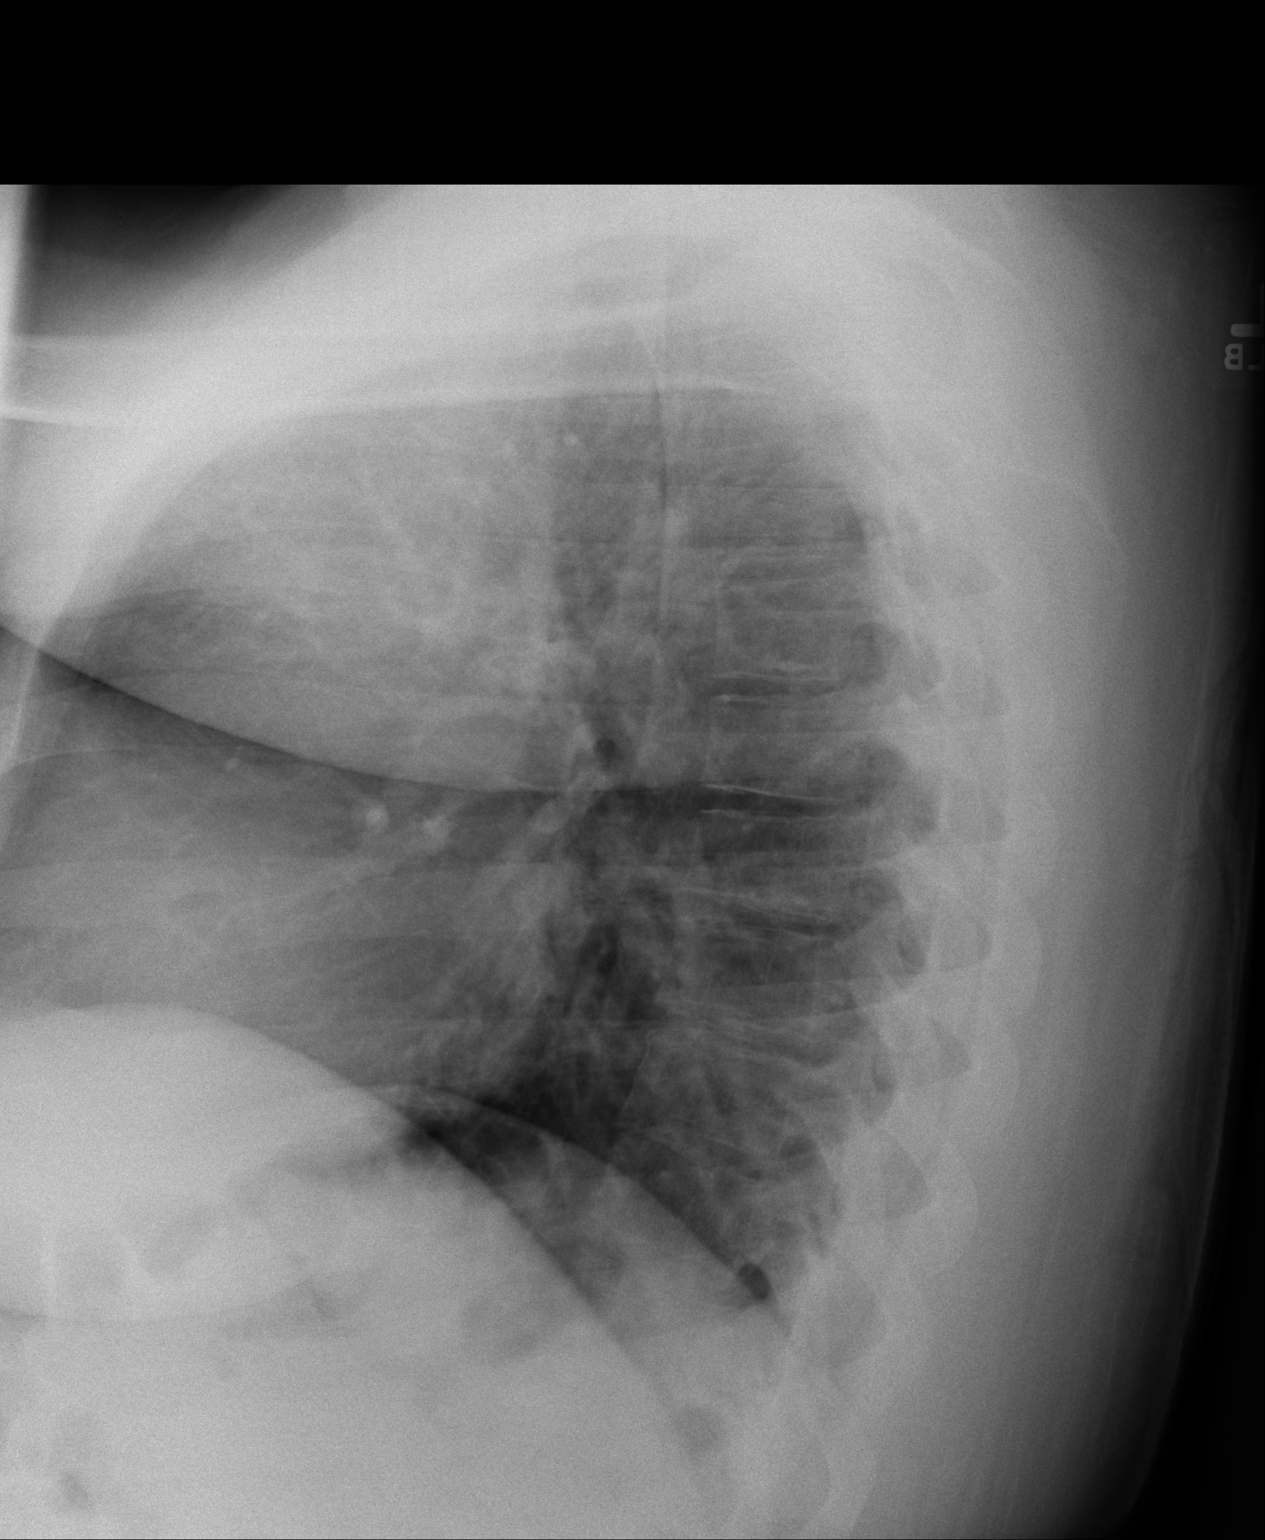
[im 3/3]
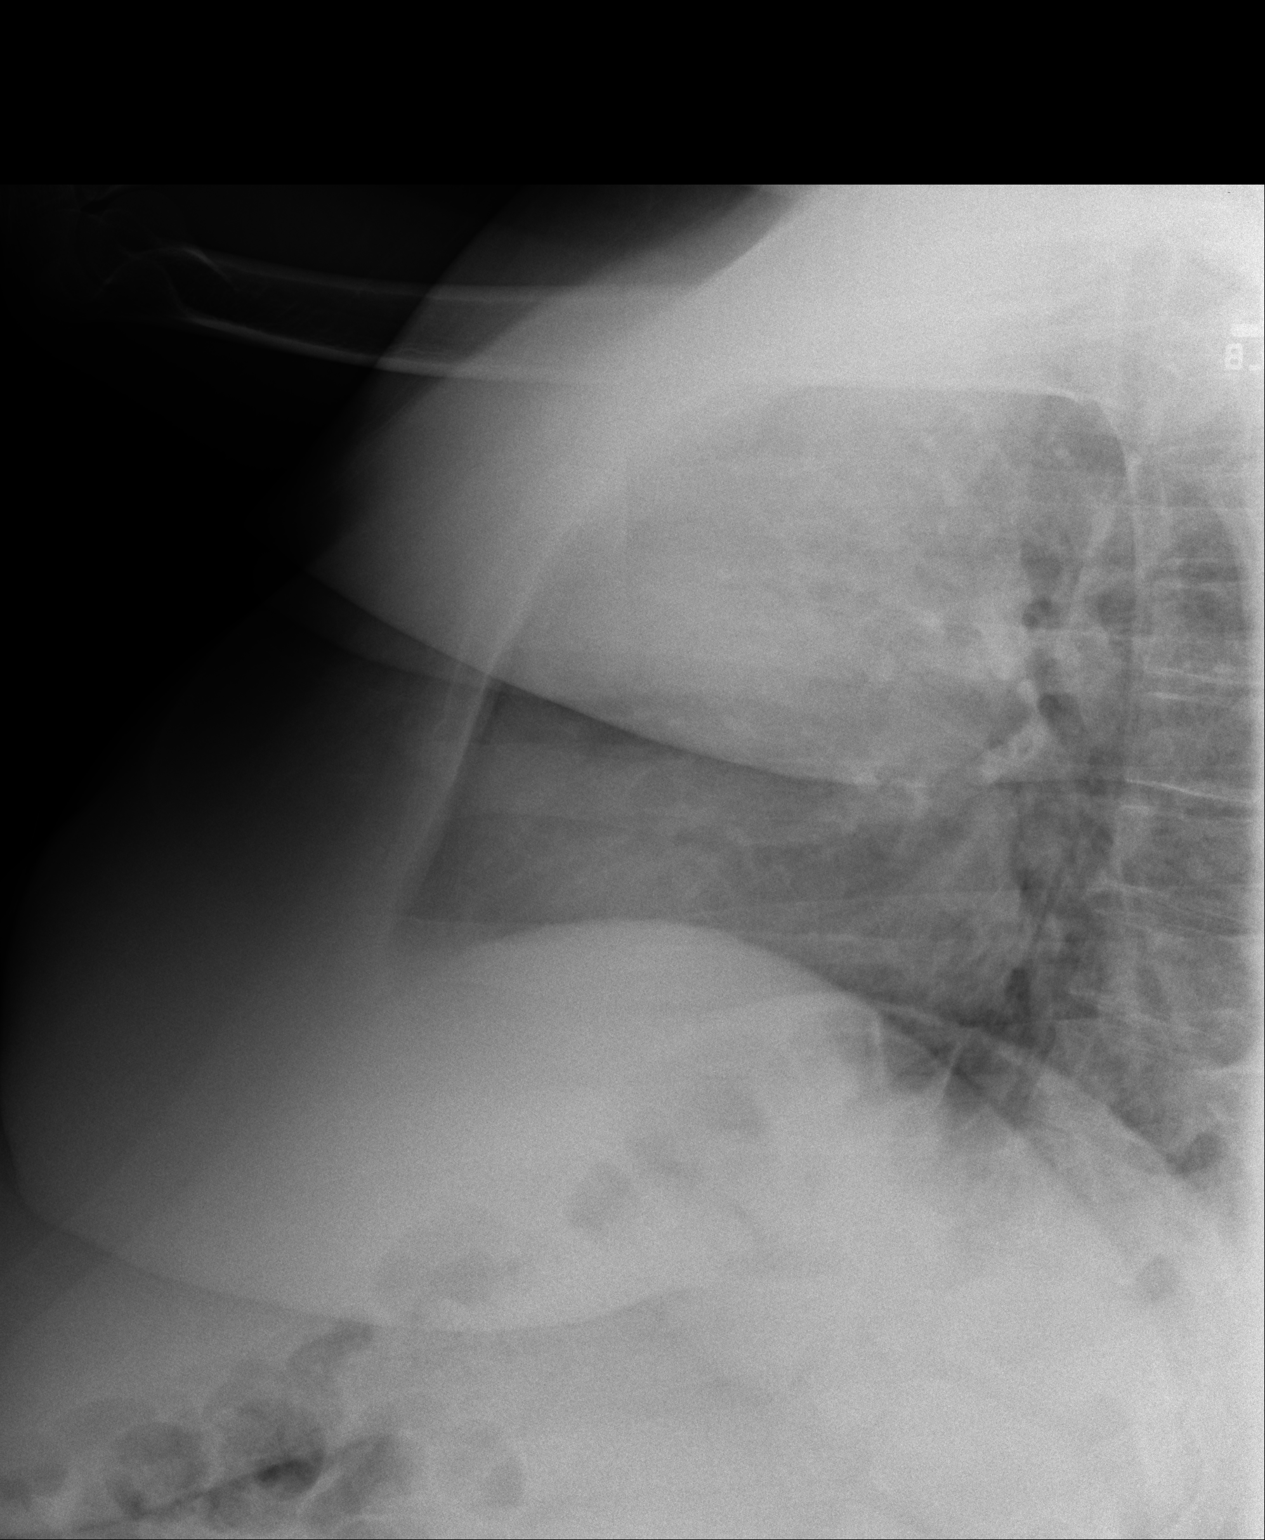

[3 of 3 positions shown; findings below may reference images not displayed]

FINDINGS: Heart is upper limits normal. The lung volumes are low. There are mild
heterogeneous opacities in the right lung base.
IMPRESSION: Mild right basilar opacities may represent atelectasis versus infection.
Followup PA and lateral chest radiograph is recommended.

[REDACTED]
